# Patient Record
Sex: Female | Born: 1940 | Race: White | Hispanic: No | Smoking: Never smoker
Health system: Southern US, Community
[De-identification: ages and names within clinical notes are randomized; demographics above are authoritative.]

## PROBLEM LIST (undated history)

## (undated) DIAGNOSIS — E119 Type 2 diabetes mellitus without complications: Secondary | ICD-10-CM

## (undated) DIAGNOSIS — J449 Chronic obstructive pulmonary disease, unspecified: Secondary | ICD-10-CM

## (undated) DIAGNOSIS — I10 Essential (primary) hypertension: Secondary | ICD-10-CM

## (undated) DIAGNOSIS — R112 Nausea with vomiting, unspecified: Secondary | ICD-10-CM

## (undated) DIAGNOSIS — E079 Disorder of thyroid, unspecified: Secondary | ICD-10-CM

## (undated) HISTORY — PX: TONSILLECTOMY: SUR1361

## (undated) HISTORY — PX: KNEE SURGERY: SHX244

## (undated) HISTORY — PX: HYSTERECTOMY ABDOMINAL WITH SALPINGECTOMY: SHX6725

---

## 2005-08-21 ENCOUNTER — Emergency Department (HOSPITAL_COMMUNITY): Admission: EM | Admit: 2005-08-21 | Discharge: 2005-08-21 | Payer: Self-pay | Admitting: Emergency Medicine

## 2020-06-21 HISTORY — PX: GALLBLADDER SURGERY: SHX652

## 2020-09-17 ENCOUNTER — Emergency Department (HOSPITAL_COMMUNITY)
Admission: EM | Admit: 2020-09-17 | Discharge: 2020-09-17 | Disposition: A | Discharge status: Home / Self Care | Payer: 59 | Attending: Emergency Medicine | Admitting: Emergency Medicine

## 2020-09-17 ENCOUNTER — Encounter (HOSPITAL_COMMUNITY): Payer: Self-pay | Admitting: Emergency Medicine

## 2020-09-17 DIAGNOSIS — Z794 Long term (current) use of insulin: Secondary | ICD-10-CM | POA: Diagnosis not present

## 2020-09-17 DIAGNOSIS — Y92009 Unspecified place in unspecified non-institutional (private) residence as the place of occurrence of the external cause: Secondary | ICD-10-CM

## 2020-09-17 DIAGNOSIS — M25512 Pain in left shoulder: Secondary | ICD-10-CM | POA: Insufficient documentation

## 2020-09-17 DIAGNOSIS — R296 Repeated falls: Secondary | ICD-10-CM | POA: Diagnosis not present

## 2020-09-17 DIAGNOSIS — R062 Wheezing: Secondary | ICD-10-CM | POA: Diagnosis not present

## 2020-09-17 DIAGNOSIS — E1165 Type 2 diabetes mellitus with hyperglycemia: Secondary | ICD-10-CM | POA: Diagnosis not present

## 2020-09-17 DIAGNOSIS — R739 Hyperglycemia, unspecified: Secondary | ICD-10-CM

## 2020-09-17 DIAGNOSIS — W19XXXA Unspecified fall, initial encounter: Secondary | ICD-10-CM

## 2020-09-17 LAB — CBC WITH DIFFERENTIAL/PLATELET
Abs Immature Granulocytes: 0.12 10*3/uL — ABNORMAL HIGH (ref 0.00–0.07)
Basophils Absolute: 0 10*3/uL (ref 0.0–0.1)
Basophils Relative: 0 %
Eosinophils Absolute: 0 10*3/uL (ref 0.0–0.5)
Eosinophils Relative: 0 %
HCT: 35.4 % — ABNORMAL LOW (ref 36.0–46.0)
Hemoglobin: 11.2 g/dL — ABNORMAL LOW (ref 12.0–15.0)
Immature Granulocytes: 1 %
Lymphocytes Relative: 8 %
Lymphs Abs: 1.1 10*3/uL (ref 0.7–4.0)
MCH: 29.8 pg (ref 26.0–34.0)
MCHC: 31.6 g/dL (ref 30.0–36.0)
MCV: 94.1 fL (ref 80.0–100.0)
Monocytes Absolute: 1 10*3/uL (ref 0.1–1.0)
Monocytes Relative: 7 %
Neutro Abs: 11.1 10*3/uL — ABNORMAL HIGH (ref 1.7–7.7)
Neutrophils Relative %: 84 %
Platelets: 255 10*3/uL (ref 150–400)
RBC: 3.76 MIL/uL — ABNORMAL LOW (ref 3.87–5.11)
RDW: 14.9 % (ref 11.5–15.5)
WBC: 13.3 10*3/uL — ABNORMAL HIGH (ref 4.0–10.5)
nRBC: 0 % (ref 0.0–0.2)

## 2020-09-17 LAB — URINALYSIS, ROUTINE W REFLEX MICROSCOPIC
Bacteria, UA: NONE SEEN
Bilirubin Urine: NEGATIVE
Glucose, UA: >=500 mg/dL — AB
Hgb urine dipstick: NEGATIVE
Ketones, ur: NEGATIVE mg/dL
Leukocytes,Ua: NEGATIVE
Nitrite: NEGATIVE
Protein, ur: NEGATIVE mg/dL
Specific Gravity, Urine: 1.028 (ref 1.005–1.030)
pH: 5 (ref 5.0–8.0)

## 2020-09-17 LAB — CBG MONITORING, ED
Glucose-Capillary: 382 mg/dL — ABNORMAL HIGH (ref 70–99)
Glucose-Capillary: 270 mg/dL — ABNORMAL HIGH (ref 70–99)

## 2020-09-17 LAB — COMPREHENSIVE METABOLIC PANEL
ALT: 16 U/L (ref 0–44)
AST: 26 U/L (ref 15–41)
Albumin: 3.3 g/dL — ABNORMAL LOW (ref 3.5–5.0)
Alkaline Phosphatase: 138 U/L — ABNORMAL HIGH (ref 38–126)
Anion gap: 9 (ref 5–15)
BUN: 17 mg/dL (ref 8–23)
CO2: 24 mmol/L (ref 22–32)
Calcium: 9.1 mg/dL (ref 8.9–10.3)
Chloride: 107 mmol/L (ref 98–111)
Creatinine, Ser: 1.24 mg/dL — ABNORMAL HIGH (ref 0.44–1.00)
GFR, Estimated: 45 mL/min — ABNORMAL LOW (ref >60)
Glucose, Bld: 427 mg/dL — ABNORMAL HIGH (ref 70–99)
Potassium: 4.4 mmol/L (ref 3.5–5.1)
Sodium: 140 mmol/L (ref 135–145)
Total Bilirubin: 0.3 mg/dL (ref 0.3–1.2)
Total Protein: 6.1 g/dL — ABNORMAL LOW (ref 6.5–8.1)

## 2020-09-17 MED ORDER — INSULIN ASPART 100 UNIT/ML ~~LOC~~ SOLN
8.0000 [IU] | Freq: Once | SUBCUTANEOUS | Status: AC
  Administered 2020-09-17: 8 [IU] via SUBCUTANEOUS

## 2020-09-17 MED ORDER — ALBUTEROL SULFATE HFA 108 (90 BASE) MCG/ACT IN AERS
2.0000 | INHALATION_SPRAY | Freq: Once | RESPIRATORY_TRACT | Status: AC
  Administered 2020-09-17: 2 via RESPIRATORY_TRACT
  Filled 2020-09-17: qty 6.7

## 2020-09-17 NOTE — ED Triage Notes (Signed)
Pt brought in by EMS from home for hyperglycemia. Crew states pt received 50 units of insulin at home by her sister prior to their arrival. Pt's blood glucose was 512.

## 2020-09-17 NOTE — ED Notes (Signed)
Pt discharged and wheeled out of the ED in a wheel chair. 

## 2020-09-17 NOTE — Discharge Instructions (Addendum)
Please check your blood sugar regularly throughout today. Return with any concern for unstable levels.   Use your walker at all times and only walk when you have assistance.   Continue your efforts with your family to get in-home assistance when you return home to [place].

## 2020-09-17 NOTE — ED Provider Notes (Signed)
[facility] EMERGENCY DEPARTMENT Provider Note   CSN: [id number] Arrival date & time: 09/17/20  0158     History Chief Complaint  Patient presents with  . Fall  . Hyperglycemia    Linda Wall is a 80 y.o. female.  Patient to ED after fall this evening. Per patient and her sister at bedside, she has been falling on a regular basis for the past 3 months. Per EMS, the patient was found to have hyperglycemia of 504 on their arrival. She self administered a dose of her insulin around 1:05 am. No nausea, vomiting. She states she falls because "I just go limp". She walk with use of a walker. She lives in [place], [place], and is here staying with her sister while work is being done to her house, but usually lives alone. She currently complains only of left shoulder pain which is not new today.   The history is provided by the patient and a relative. No language interpreter was used.  Fall Pertinent negatives include no chest pain, no abdominal pain, no headaches and no shortness of breath.  Hyperglycemia Associated symptoms: no abdominal pain, no chest pain, no dizziness, no fever, no nausea, no shortness of breath and no weakness        No past medical history on file.  There are no problems to display for this patient.   OB History   No obstetric history on file.     No family history on file.     Home Medications Prior to Admission medications   Not on File    Allergies    Patient has no allergy information on record.  Review of Systems   Review of Systems  Constitutional: Negative for chills and fever.  HENT: Negative.   Respiratory: Negative.  Negative for shortness of breath.   Cardiovascular: Negative.  Negative for chest pain.  Gastrointestinal: Negative.  Negative for abdominal pain and nausea.  Musculoskeletal:       Left shoulder pain  Skin: Positive for color change.  Neurological: Negative for dizziness, weakness and headaches.       UE  tremor present.     Physical Exam Updated Vital Signs There were no vitals taken for this visit.  Physical Exam Vitals and nursing note reviewed.  Constitutional:      General: She is not in acute distress.    Appearance: Normal appearance. She is obese.  HENT:     Head: Normocephalic and atraumatic.  Eyes:     Conjunctiva/sclera: Conjunctivae normal.  Cardiovascular:     Rate and Rhythm: Normal rate and regular rhythm.     Heart sounds: No murmur heard.   Pulmonary:     Effort: Pulmonary effort is normal.     Breath sounds: Wheezing (Audible wheezing) present. No rhonchi or rales.  Abdominal:     General: There is no distension.     Palpations: Abdomen is soft.     Tenderness: There is no abdominal tenderness.  Musculoskeletal:        General: Normal range of motion.     Cervical back: Normal range of motion and neck supple.  Skin:    General: Skin is warm and dry.     Findings: Bruising (bruising to multiple areas of UE's in various stages of healing. ) present.  Neurological:     Mental Status: She is alert and oriented to person, place, and time.     Motor: No weakness.     Comments:  UE tremor present     ED Results / Procedures / Treatments   Labs (all labs ordered are listed, but only abnormal results are displayed) Labs Reviewed - No data to display Results for orders placed or performed during the hospital encounter of 09/17/20  Urine culture   Specimen: Urine, Random  Result Value Ref Range   Specimen Description URINE, RANDOM    Special Requests NONE    Culture (A)     >=100,000 COLONIES/mL AEROCOCCUS SPECIES Standardized susceptibility testing for this organism is not available. Performed at [facility], [address], [place], [place] [address]    Report Status 09/19/2020 FINAL   CBC with Differential  Result Value Ref Range   WBC 13.3 (H) 4.0 - 10.5 K/uL   RBC 3.76 (L) 3.87 - 5.11 MIL/uL   Hemoglobin 11.2 (L) 12.0 - 15.0 g/dL   HCT  66.0 (L) 63.0 - 46.0 %   MCV 94.1 80.0 - 100.0 fL   MCH 29.8 26.0 - 34.0 pg   MCHC 31.6 30.0 - 36.0 g/dL   RDW 16.0 10.9 - 32.3 %   Platelets 255 150 - 400 K/uL   nRBC 0.0 0.0 - 0.2 %   Neutrophils Relative % 84 %   Neutro Abs 11.1 (H) 1.7 - 7.7 K/uL   Lymphocytes Relative 8 %   Lymphs Abs 1.1 0.7 - 4.0 K/uL   Monocytes Relative 7 %   Monocytes Absolute 1.0 0.1 - 1.0 K/uL   Eosinophils Relative 0 %   Eosinophils Absolute 0.0 0.0 - 0.5 K/uL   Basophils Relative 0 %   Basophils Absolute 0.0 0.0 - 0.1 K/uL   Immature Granulocytes 1 %   Abs Immature Granulocytes 0.12 (H) 0.00 - 0.07 K/uL  Comprehensive metabolic panel  Result Value Ref Range   Sodium 140 135 - 145 mmol/L   Potassium 4.4 3.5 - 5.1 mmol/L   Chloride 107 98 - 111 mmol/L   CO2 24 22 - 32 mmol/L   Glucose, Bld 427 (H) 70 - 99 mg/dL   BUN 17 8 - 23 mg/dL   Creatinine, Ser 5.57 (H) 0.44 - 1.00 mg/dL   Calcium 9.1 8.9 - 32.2 mg/dL   Total Protein 6.1 (L) 6.5 - 8.1 g/dL   Albumin 3.3 (L) 3.5 - 5.0 g/dL   AST 26 15 - 41 U/L   ALT 16 0 - 44 U/L   Alkaline Phosphatase 138 (H) 38 - 126 U/L   Total Bilirubin 0.3 0.3 - 1.2 mg/dL   GFR, Estimated 45 (L) >60 mL/min   Anion gap 9 5 - 15  Urinalysis, Routine w reflex microscopic Urine, Clean Catch  Result Value Ref Range   Color, Urine YELLOW YELLOW   APPearance CLEAR CLEAR   Specific Gravity, Urine 1.028 1.005 - 1.030   pH 5.0 5.0 - 8.0   Glucose, UA >=500 (A) NEGATIVE mg/dL   Hgb urine dipstick NEGATIVE NEGATIVE   Bilirubin Urine NEGATIVE NEGATIVE   Ketones, ur NEGATIVE NEGATIVE mg/dL   Protein, ur NEGATIVE NEGATIVE mg/dL   Nitrite NEGATIVE NEGATIVE   Leukocytes,Ua NEGATIVE NEGATIVE   RBC / HPF 6-10 0 - 5 RBC/hpf   WBC, UA 6-10 0 - 5 WBC/hpf   Bacteria, UA NONE SEEN NONE SEEN   Squamous Epithelial / LPF 0-5 0 - 5   Amorphous Crystal PRESENT   CBG monitoring, ED  Result Value Ref Range   Glucose-Capillary 382 (H) 70 - 99 mg/dL  POC CBG, ED  Result Value Ref Range    Glucose-Capillary 270 (H) 70 - 99 mg/dL    EKG None  Radiology No results found.  Procedures Procedures   Medications Ordered in ED Medications - No data to display  ED Course  I have reviewed the triage vital signs and the nursing notes.  Pertinent labs & imaging results that were available during my care of the patient were reviewed by me and considered in my medical decision making (see chart for details).    MDM Rules/Calculators/A&P                          Patient with frequent falls presents with fall tonight while with her sister and concern for elevated blood sugar.   The patient lives alone in [place]. She wants to continue living alone and does not want to consider nursing home placement. She reports that her son is working on in-home care to support this.   CBG checked in the ED over observation period. Labs reviewed and are reassuring. No imaging is felt indicated. She complains of left shoulder pain but this has been evaluated previously and is an ongoing issue.  Sister feels she has remained at baseline. VSS. CBG improved. She can be discharged home into her sister's care with plan to return home and pursuit of in home services with her son.   Final Clinical Impression(s) / ED Diagnoses Final diagnoses:  None   1. Fall 2. Hyperglycemia without ketosis.  Rx / DC Orders ED Discharge Orders    None       Danne Harbor 09/23/20 1610    Palumbo, April, MD 09/23/20 2300

## 2020-09-19 LAB — URINE CULTURE: Culture: >=100000 — AB

## 2020-11-19 ENCOUNTER — Ambulatory Visit
Admission: EM | Admit: 2020-11-19 | Discharge: 2020-11-19 | Disposition: A | Discharge status: Home / Self Care | Payer: 59

## 2020-11-19 ENCOUNTER — Other Ambulatory Visit: Payer: Self-pay

## 2020-11-19 DIAGNOSIS — S51811A Laceration without foreign body of right forearm, initial encounter: Secondary | ICD-10-CM | POA: Diagnosis not present

## 2020-11-19 HISTORY — DX: Type 2 diabetes mellitus without complications: E11.9

## 2020-11-19 HISTORY — DX: Chronic obstructive pulmonary disease, unspecified: J44.9

## 2020-11-19 HISTORY — DX: Essential (primary) hypertension: I10

## 2020-11-19 HISTORY — DX: Disorder of thyroid, unspecified: E07.9

## 2020-11-19 MED ORDER — MUPIROCIN 2 % EX OINT: 1.0000 "application " | TOPICAL_OINTMENT | Freq: Two times a day (BID) | CUTANEOUS | 0 refills | Status: AC

## 2020-11-19 NOTE — ED Provider Notes (Addendum)
[facility]    CSN: [id number] Arrival date & time: 11/19/20  1128      History   Chief Complaint Chief Complaint  Patient presents with  . Extremity Laceration    Right arm      HPI Linda Wall is a 80 y.o. female history of hypertension, COPD, DM type II presenting today for evaluation of skin tear.  Sustained skin tear to right forearm last night, tripped and fell outside and landed in the grass.  Reports that this was a mechanical fall and denies any preceding dizziness lightheadedness or syncope.  Denies hitting head or loss of consciousness.  Denies use of blood thinners.  Denies any difficulty bending or moving her arm.  Believes tetanus up-to-date  HPI  Past Medical History:  Diagnosis Date  . COPD (chronic obstructive pulmonary disease) (HCC)   . Diabetes mellitus without complication (HCC)   . Hypertension   . Thyroid disease     There are no problems to display for this patient.   Past Surgical History:  Procedure Laterality Date  . GALLBLADDER SURGERY  2022  . HYSTERECTOMY ABDOMINAL WITH SALPINGECTOMY    . KNEE SURGERY Right   . TONSILLECTOMY      OB History   No obstetric history on file.      Home Medications    Prior to Admission medications   Medication Sig Start Date End Date Taking? Authorizing Provider  buPROPion (WELLBUTRIN XL) 150 MG 24 hr tablet 150 mg per 1 tablet, ORAL, qAM (every morning), 30 tablet, 11 Refill(s), Pharmacy: [id number], Height, 165.1, cm, 11/27/19 10:07:00 EDT, Weight, 84.142, kg, 11/27/19 10:07:00 EDT 11/27/19  Yes [provider]  diclofenac Sodium (VOLTAREN) 1 % GEL  04/04/20  Yes [provider]  escitalopram (LEXAPRO) 10 MG tablet 1 tablet, ORAL, Daily, 30 tablet, 6 Refill(s), Pharmacy: [id number], DX Code Needed  ., Height, 165.1, cm, 12/27/19 12:38:00 EDT, Weight, 84.188, kg, 12/27/19 12:38:00 EDT 12/27/19  Yes [provider]  esomeprazole (NEXIUM) 40 MG capsule  Take by mouth. 08/08/16  Yes [provider]  furosemide (LASIX) 40 MG tablet Take by mouth. 08/30/16  Yes [provider]  insulin glargine (LANTUS SOLOSTAR) 100 UNIT/ML Solostar Pen Inject into the skin. 08/27/16  Yes [provider]  Insulin Pen Needle (B-D ULTRAFINE III SHORT PEN) 31G X 8 MM MISC  08/25/16  Yes [provider]  levothyroxine (SYNTHROID) 100 MCG tablet Take by mouth. 06/21/16  Yes [provider]  LORazepam (ATIVAN) 1 MG tablet 1 mg per 1 tablet, ORAL, TID (3 times a day), prn anxiety/tremor. Do not take with Tramadol., 30 day(s), 90 tablet, 0 Refill(s), Pharmacy: [facility] [place], [place]), Height, 165.1, cm, 04/04/20 9:19:00 EDT, Weight, 83.462, kg, 04/04/20 9:19:00 EDT 04/14/20  Yes [provider]  meloxicam (MOBIC) 7.5 MG tablet Take by mouth. 08/27/16  Yes [provider]  mupirocin ointment (BACTROBAN) 2 % Apply 1 application topically 2 (two) times daily. 11/19/20  Yes Germani Gavilanes C, PA-C  pantoprazole (PROTONIX) 40 MG tablet Take by mouth. 07/20/18  Yes [provider]  simvastatin (ZOCOR) 40 MG tablet Take by mouth. 07/15/16  Yes [provider]  tamoxifen (NOLVADEX) 20 MG tablet Take by mouth. 08/25/16  Yes [provider]  traMADol Janean Sark) 50 MG tablet Take by mouth. 08/16/16  Yes [provider]  ACCU-CHEK GUIDE test strip  09/19/20   [provider]  albuterol (VENTOLIN HFA) 108 (90 Base) MCG/ACT inhaler Inhale  into the lungs. 09/09/20   [provider]  ANORO ELLIPTA 62.5-25 MCG/INH AEPB Inhale 1 puff into the lungs daily. 10/14/20   [provider]  cetirizine (ZYRTEC) 10 MG tablet Take by mouth.    [provider]  LORazepam (ATIVAN) 0.5 MG tablet Take 0.5 mg by mouth daily as needed. 10/02/20   [provider]  metFORMIN (GLUCOPHAGE-XR) 500 MG 24 hr tablet Take 500 mg by mouth 2 (two) times daily. 10/01/20   [provider]   ondansetron (ZOFRAN) 4 MG tablet Take by mouth.    [provider]  pioglitazone (ACTOS) 15 MG tablet Take 15 mg by mouth daily. 08/28/20   [provider]  tiZANidine (ZANAFLEX) 4 MG tablet Take 4 mg by mouth at bedtime. 09/19/20   [provider]  tolterodine (DETROL LA) 4 MG 24 hr capsule Take by mouth.    [provider]  VICTOZA 18 MG/3ML SOPN Inject into the skin. 10/08/20   [provider]    Family History History reviewed. No pertinent family history.  Social History Social History   Tobacco Use  . Smoking status: Never Smoker  . Smokeless tobacco: Never Used  Vaping Use  . Vaping Use: Never used  Substance Use Topics  . Alcohol use: Not Currently  . Drug use: Never     Allergies   Bactrim [sulfamethoxazole-trimethoprim], Shellfish-derived products, and Sulfa antibiotics   Review of Systems Review of Systems  Constitutional: Negative for fatigue and fever.  HENT: Negative for mouth sores.   Eyes: Negative for visual disturbance.  Respiratory: Negative for shortness of breath.   Cardiovascular: Negative for chest pain.  Gastrointestinal: Negative for abdominal pain, nausea and vomiting.  Genitourinary: Negative for genital sores.  Musculoskeletal: Negative for arthralgias and joint swelling.  Skin: Positive for color change and wound. Negative for rash.  Neurological: Negative for dizziness, weakness, light-headedness and headaches.     Physical Exam Triage Vital Signs ED Triage Vitals  Enc Vitals Group     BP 11/19/20 1141 127/70     Pulse Rate 11/19/20 1141 87     Resp --      Temp 11/19/20 1141 97.7 F (36.5 C)     Temp Source 11/19/20 1141 Oral     SpO2 11/19/20 1141 95 %     Weight --      Height --      Head Circumference --      Peak Flow --      Pain Score 11/19/20 1149 0     Pain Loc --      Pain Edu? --      Excl. in GC? --    No data found.  Updated Vital Signs BP 127/70 (BP Location: Left  Arm)   Pulse 87   Temp 97.7 F (36.5 C) (Oral)   SpO2 95%   Visual Acuity Right Eye Distance:   Left Eye Distance:   Bilateral Distance:    Right Eye Near:   Left Eye Near:    Bilateral Near:     Physical Exam Vitals and nursing note reviewed.  Constitutional:      Appearance: She is well-developed.     Comments: No acute distress  HENT:     Head: Normocephalic and atraumatic.     Nose: Nose normal.  Eyes:     Conjunctiva/sclera: Conjunctivae normal.  Cardiovascular:     Rate and Rhythm: Normal rate.  Pulmonary:     Effort: Pulmonary effort  is normal. No respiratory distress.  Abdominal:     General: There is no distension.  Musculoskeletal:        General: Normal range of motion.     Cervical back: Neck supple.     Comments: Patient willingly and actively using right elbow hand and wrist  Skin:    General: Skin is warm and dry.     Comments: Large skin tear noted over proximal forearm, bleeding controlled  Neurological:     Mental Status: She is alert and oriented to person, place, and time.      UC Treatments / Results  Labs (all labs ordered are listed, but only abnormal results are displayed) Labs Reviewed - No data to display  EKG   Radiology No results found.  Procedures Procedures (including critical care time)  Medications Ordered in UC Medications - No data to display  Initial Impression / Assessment and Plan / UC Course  I have reviewed the triage vital signs and the nursing notes.  Pertinent labs & imaging results that were available during my care of the patient were reviewed by me and considered in my medical decision making (see chart for details).     Wound irrigated with saline and Shur-Clens, reapproximated edges of skin tear as best as possible and applied Steri-Strips to keep in place well-healing, discussed wound care, cleansing and monitoring for gradual resolution.  Monitor for signs of infection.  Low suspicion of underlying  fracture.  Discussed strict return precautions. Patient verbalized understanding and is agreeable with plan.  Final Clinical Impressions(s) / UC Diagnoses   Final diagnoses:  Skin tear of right forearm without complication, initial encounter     Discharge Instructions     Keep clean and dry Steri strips will fall off gradually, may trim edges if curling up Wash warm soapy watery twice daily and dry well afterward May use bactroban ointment to area Use non-adherent dressings Follow-up if developing any concerns regarding healing or any signs of infection   ED Prescriptions    Medication Sig Dispense Auth. Provider   mupirocin ointment (BACTROBAN) 2 % Apply 1 application topically 2 (two) times daily. 30 g Riko Lumsden, [place] C, PA-C     PDMP not reviewed this encounter.   Lew Dawes, PA-C 11/19/20 1242    Dallen Bunte, [place] C, PA-C 11/19/20 1243

## 2020-11-19 NOTE — ED Triage Notes (Addendum)
Patient presents to Urgent Care with complaints of a right lower arm laceration. She states she tripped and fell. EMS was called and pt accessed. They instructed her to go to hospital pt declined. She reports EMS wrapped her arm with guaze and secured with a bandage.  Denies hitting her head, losing consciousness, or any numbness/tingling to arm.

## 2020-11-19 NOTE — Discharge Instructions (Signed)
Keep clean and dry Steri strips will fall off gradually, may trim edges if curling up Wash warm soapy watery twice daily and dry well afterward May use bactroban ointment to area Use non-adherent dressings Follow-up if developing any concerns regarding healing or any signs of infection

## 2020-11-25 ENCOUNTER — Other Ambulatory Visit: Payer: Self-pay | Admitting: Urology

## 2020-11-25 DIAGNOSIS — N200 Calculus of kidney: Secondary | ICD-10-CM

## 2020-11-28 ENCOUNTER — Encounter (HOSPITAL_BASED_OUTPATIENT_CLINIC_OR_DEPARTMENT_OTHER): Payer: Self-pay | Admitting: Urology

## 2020-11-28 NOTE — Progress Notes (Signed)
Spoke with patients daughter in law, Steward Drone who wanted to take instructions for procedure on Monday. She will call back with an updated list of patients medications this afternoon. We did review procedure instructions. Patient to arrive at 0815 on 12/01/2020. NPO after MN on Sunday except for clear liquids until 0615. Driver secured.

## 2020-11-28 NOTE — Progress Notes (Signed)
Medications reviewed with daughter in law. Instructed to take BP medication am of procedure. Patient reports to daughter in law that she takes her metformin and insulin in am only. Instructed to hold these on Monday morning.

## 2020-12-01 ENCOUNTER — Ambulatory Visit (HOSPITAL_COMMUNITY): Payer: 59

## 2020-12-01 ENCOUNTER — Other Ambulatory Visit: Payer: Self-pay

## 2020-12-01 ENCOUNTER — Encounter (HOSPITAL_BASED_OUTPATIENT_CLINIC_OR_DEPARTMENT_OTHER): Payer: Self-pay | Admitting: Urology

## 2020-12-01 ENCOUNTER — Ambulatory Visit (HOSPITAL_BASED_OUTPATIENT_CLINIC_OR_DEPARTMENT_OTHER)
Admission: RE | Admit: 2020-12-01 | Discharge: 2020-12-01 | Disposition: A | Discharge status: Home / Self Care | Payer: 59 | Attending: Urology | Admitting: Urology

## 2020-12-01 ENCOUNTER — Encounter (HOSPITAL_BASED_OUTPATIENT_CLINIC_OR_DEPARTMENT_OTHER)
Admission: RE | Disposition: A | Discharge status: Home / Self Care | Payer: Self-pay | Source: Home / Self Care | Attending: Urology

## 2020-12-01 DIAGNOSIS — Z881 Allergy status to other antibiotic agents status: Secondary | ICD-10-CM | POA: Diagnosis not present

## 2020-12-01 DIAGNOSIS — E119 Type 2 diabetes mellitus without complications: Secondary | ICD-10-CM | POA: Diagnosis not present

## 2020-12-01 DIAGNOSIS — I10 Essential (primary) hypertension: Secondary | ICD-10-CM | POA: Diagnosis not present

## 2020-12-01 DIAGNOSIS — Z882 Allergy status to sulfonamides status: Secondary | ICD-10-CM | POA: Insufficient documentation

## 2020-12-01 DIAGNOSIS — J449 Chronic obstructive pulmonary disease, unspecified: Secondary | ICD-10-CM | POA: Diagnosis not present

## 2020-12-01 DIAGNOSIS — N200 Calculus of kidney: Secondary | ICD-10-CM

## 2020-12-01 HISTORY — DX: Nausea with vomiting, unspecified: R11.2

## 2020-12-01 HISTORY — PX: EXTRACORPOREAL SHOCK WAVE LITHOTRIPSY: SHX1557

## 2020-12-01 LAB — GLUCOSE, CAPILLARY
Glucose-Capillary: 100 mg/dL — ABNORMAL HIGH (ref 70–99)
Glucose-Capillary: 54 mg/dL — ABNORMAL LOW (ref 70–99)
Glucose-Capillary: 187 mg/dL — ABNORMAL HIGH (ref 70–99)
Glucose-Capillary: 67 mg/dL — ABNORMAL LOW (ref 70–99)
Glucose-Capillary: 78 mg/dL (ref 70–99)

## 2020-12-01 SURGERY — LITHOTRIPSY, ESWL
Anesthesia: LOCAL | Laterality: Right

## 2020-12-01 MED ORDER — HYDROCODONE-ACETAMINOPHEN 10-325 MG PO TABS: 1.0000 | ORAL_TABLET | Freq: Four times a day (QID) | ORAL | 0 refills | Status: AC | PRN

## 2020-12-01 MED ORDER — DEXTROSE 50 % IV SOLN
50.0000 mL | Freq: Once | INTRAVENOUS | Status: AC
  Administered 2020-12-01: 50 mL via INTRAVENOUS

## 2020-12-01 MED ORDER — TAMSULOSIN HCL 0.4 MG PO CAPS: 0.4000 mg | ORAL_CAPSULE | Freq: Every day | ORAL | 0 refills | Status: AC

## 2020-12-01 MED ORDER — SODIUM CHLORIDE 0.9 % IV SOLN: INTRAVENOUS | Status: DC

## 2020-12-01 MED ORDER — CIPROFLOXACIN HCL 500 MG PO TABS
ORAL_TABLET | ORAL | Status: AC
  Filled 2020-12-01: qty 1

## 2020-12-01 MED ORDER — DEXTROSE 50 % IV SOLN
INTRAVENOUS | Status: AC
  Filled 2020-12-01: qty 50

## 2020-12-01 MED ORDER — DIPHENHYDRAMINE HCL 25 MG PO CAPS
ORAL_CAPSULE | ORAL | Status: AC
  Filled 2020-12-01: qty 1

## 2020-12-01 MED ORDER — DIPHENHYDRAMINE HCL 25 MG PO CAPS
25.0000 mg | ORAL_CAPSULE | ORAL | Status: AC
  Administered 2020-12-01: 25 mg via ORAL

## 2020-12-01 MED ORDER — DOCUSATE SODIUM 100 MG PO CAPS: 100.0000 mg | ORAL_CAPSULE | Freq: Every day | ORAL | 0 refills | Status: AC | PRN

## 2020-12-01 MED ORDER — DIAZEPAM 5 MG PO TABS
10.0000 mg | ORAL_TABLET | ORAL | Status: AC
  Administered 2020-12-01: 10 mg via ORAL

## 2020-12-01 MED ORDER — DIAZEPAM 5 MG PO TABS
ORAL_TABLET | ORAL | Status: AC
  Filled 2020-12-01: qty 2

## 2020-12-01 MED ORDER — CIPROFLOXACIN HCL 500 MG PO TABS
500.0000 mg | ORAL_TABLET | ORAL | Status: AC
  Administered 2020-12-01: 500 mg via ORAL

## 2020-12-01 NOTE — Op Note (Signed)
[facility] Operative Note  Treating Physician: Arvo Ealy, MD  Pre-op diagnosis: Right renal stone  Post-op diagnosis: Same   Procedure: Right ESWL  See [facility] OP note scanned into chart. Also because of the size, density, location and other factors that cannot be anticipated I feel this will likely be a staged procedure. This fact supersedes any indication in the scanned [facility] operative note to the contrary.  Matt R. Ashla Murph MD [facility]  Pager: [phone]   

## 2020-12-01 NOTE — H&P (Signed)
Urology Preoperative H&P   Chief Complaint: Right renal stone  History of Present Illness: Linda Wall is a 80 y.o. female with a right renal stone here for right ESWL. Denies fevers, chills, dysuria.  Past Medical History:  Diagnosis Date   COPD (chronic obstructive pulmonary disease) (HCC)    Diabetes mellitus without complication (HCC)    Hypertension    PONV (postoperative nausea and vomiting)    Thyroid disease     Past Surgical History:  Procedure Laterality Date   GALLBLADDER SURGERY  2022   HYSTERECTOMY ABDOMINAL WITH SALPINGECTOMY     KNEE SURGERY Right    TONSILLECTOMY      Allergies:  Allergies  Allergen Reactions   Bactrim [Sulfamethoxazole-Trimethoprim]    Shellfish-Derived Products     Allergic to sulfa drugs    Sulfa Antibiotics Rash    History reviewed. No pertinent family history.  Social History:  reports that she has never smoked. She has never used smokeless tobacco. She reports previous alcohol use. She reports that she does not use drugs.  ROS: A complete review of systems was performed.  All systems are negative except for pertinent findings as noted.  Physical Exam:  Vital signs in last 24 hours: Temp:  [97.1 F (36.2 C)] 97.1 F (36.2 C) (06/13 1011) Pulse Rate:  [81] 81 (06/13 1011) Resp:  [18] 18 (06/13 1011) BP: (161)/(89) 161/89 (06/13 1011) SpO2:  [96 %] 96 % (06/13 1011) Weight:  [79.8 kg] 79.8 kg (06/13 1011) Constitutional:  Alert and oriented, No acute distress Cardiovascular: Regular rate and rhythm Respiratory: Normal respiratory effort, Lungs clear bilaterally GI: Abdomen is soft, nontender, nondistended, no abdominal masses GU: No CVA tenderness Lymphatic: No lymphadenopathy Neurologic: Grossly intact, no focal deficits Psychiatric: Normal mood and affect  Laboratory Data:  No results for input(s): WBC, HGB, HCT, PLT in the last 72 hours.  No results for input(s): NA, K, CL, GLUCOSE, BUN, CALCIUM, CREATININE in the last  72 hours.  Invalid input(s): CO3   Results for orders placed or performed during the hospital encounter of 12/01/20 (from the past 24 hour(s))  Glucose, capillary     Status: Abnormal   Collection Time: 12/01/20 10:07 AM  Result Value Ref Range   Glucose-Capillary 100 (H) 70 - 99 mg/dL  Glucose, capillary     Status: Abnormal   Collection Time: 12/01/20  2:12 PM  Result Value Ref Range   Glucose-Capillary 54 (L) 70 - 99 mg/dL  Glucose, capillary     Status: Abnormal   Collection Time: 12/01/20  2:40 PM  Result Value Ref Range   Glucose-Capillary 187 (H) 70 - 99 mg/dL   No results found for this or any previous visit (from the past 240 hour(s)).  Renal Function: No results for input(s): CREATININE in the last 168 hours. CrCl cannot be calculated (Patient's most recent lab result is older than the maximum 21 days allowed.).  Radiologic Imaging: DG Abd 1 View  Result Date: 12/01/2020 CLINICAL DATA:  Pre lithotripsy for right-sided stone. EXAM: ABDOMEN - 1 VIEW COMPARISON:  CT 11/07/2020.  Radiograph 11/19/2020 FINDINGS: Elongated 10 mm calcification projects over the central right renal shadow corresponding to stone on prior CT. No evidence of additional renal calculi. Calcifications in the left pelvis are vascular and phleboliths. Normal bowel gas pattern with small volume of colonic stool. Included lung bases are clear. Scoliotic curvature of the spine. No acute osseous abnormalities are seen. IMPRESSION: A 10 mm calcification projects over the central right  renal shadow corresponding to stone on prior CT. Electronically Signed   By: Narda Rutherford M.D.   On: 12/01/2020 13:53    I independently reviewed the above imaging studies.  Assessment and Plan Linda Wall is a 80 y.o. female with right renal stone here for right ESWL. Denies fevers, chills, dysuria.  The risks, benefits and alternatives of right ESWL was discussed with the patient. I described the risks which include  arrhythmia, kidney contusion, kidney hemorrhage, need for transfusion, back discomfort, flank ecchymosis, flank abrasion, inability to fracture the stone, inability to pass stone fragments, Steinstrasse, infection associated with obstructing stones, need for an alternative surgical procedure and possible need for repeat shockwave lithotripsy.  The patient voices understanding and wishes to proceed.    Matt R. Syrus Nakama MD 12/01/2020, 3:11 PM  [facility] Pager: [phone]

## 2020-12-01 NOTE — Discharge Instructions (Addendum)
Activity:  You are encouraged to ambulate frequently (about every hour during waking hours) to help prevent blood clots from forming in your legs or lungs.    Diet: You should advance your diet as instructed by your physician.  It will be normal to have some bloating, nausea, and abdominal discomfort intermittently.  Prescriptions:  You will be provided a prescription for pain medication to take as needed.  If your pain is not severe enough to require the prescription pain medication, you may take extra strength Tylenol instead which will have less side effects.  You should also take a prescribed stool softener to avoid straining with bowel movements as the prescription pain medication may constipate you.  What to call us about: You should call the office ([phone]) if you develop fever > 101 or develop persistent vomiting. Activity:  You are encouraged to ambulate frequently (about every hour during waking hours) to help prevent blood clots from forming in your legs or lungs.          Post Anesthesia Home Care Instructions  Activity: Get plenty of rest for the remainder of the day. A responsible individual must stay with you for 24 hours following the procedure.  For the next 24 hours, DO NOT: -Drive a car -Operate machinery -Drink alcoholic beverages -Take any medication unless instructed by your physician -Make any legal decisions or sign important papers.  Meals: Start with liquid foods such as gelatin or soup. Progress to regular foods as tolerated. Avoid greasy, spicy, heavy foods. If nausea and/or vomiting occur, drink only clear liquids until the nausea and/or vomiting subsides. Call your physician if vomiting continues.  Special Instructions/Symptoms: Your throat may feel dry or sore from the anesthesia or the breathing tube placed in your throat during surgery. If this causes discomfort, gargle with warm salt water. The discomfort should disappear within 24 hours.       

## 2020-12-02 ENCOUNTER — Encounter (HOSPITAL_BASED_OUTPATIENT_CLINIC_OR_DEPARTMENT_OTHER): Payer: Self-pay | Admitting: Urology

## 2023-06-11 IMAGING — DX DG ABDOMEN 1V
2 series · 2 of 2 positions shown · non-contrast
Comparison: CT 11/07/2020.  Radiograph 11/19/2020

CLINICAL DATA: Pre lithotripsy for right-sided stone.

EXAM:
ABDOMEN - 1 VIEW

[abdomen kub (1 of 2)]
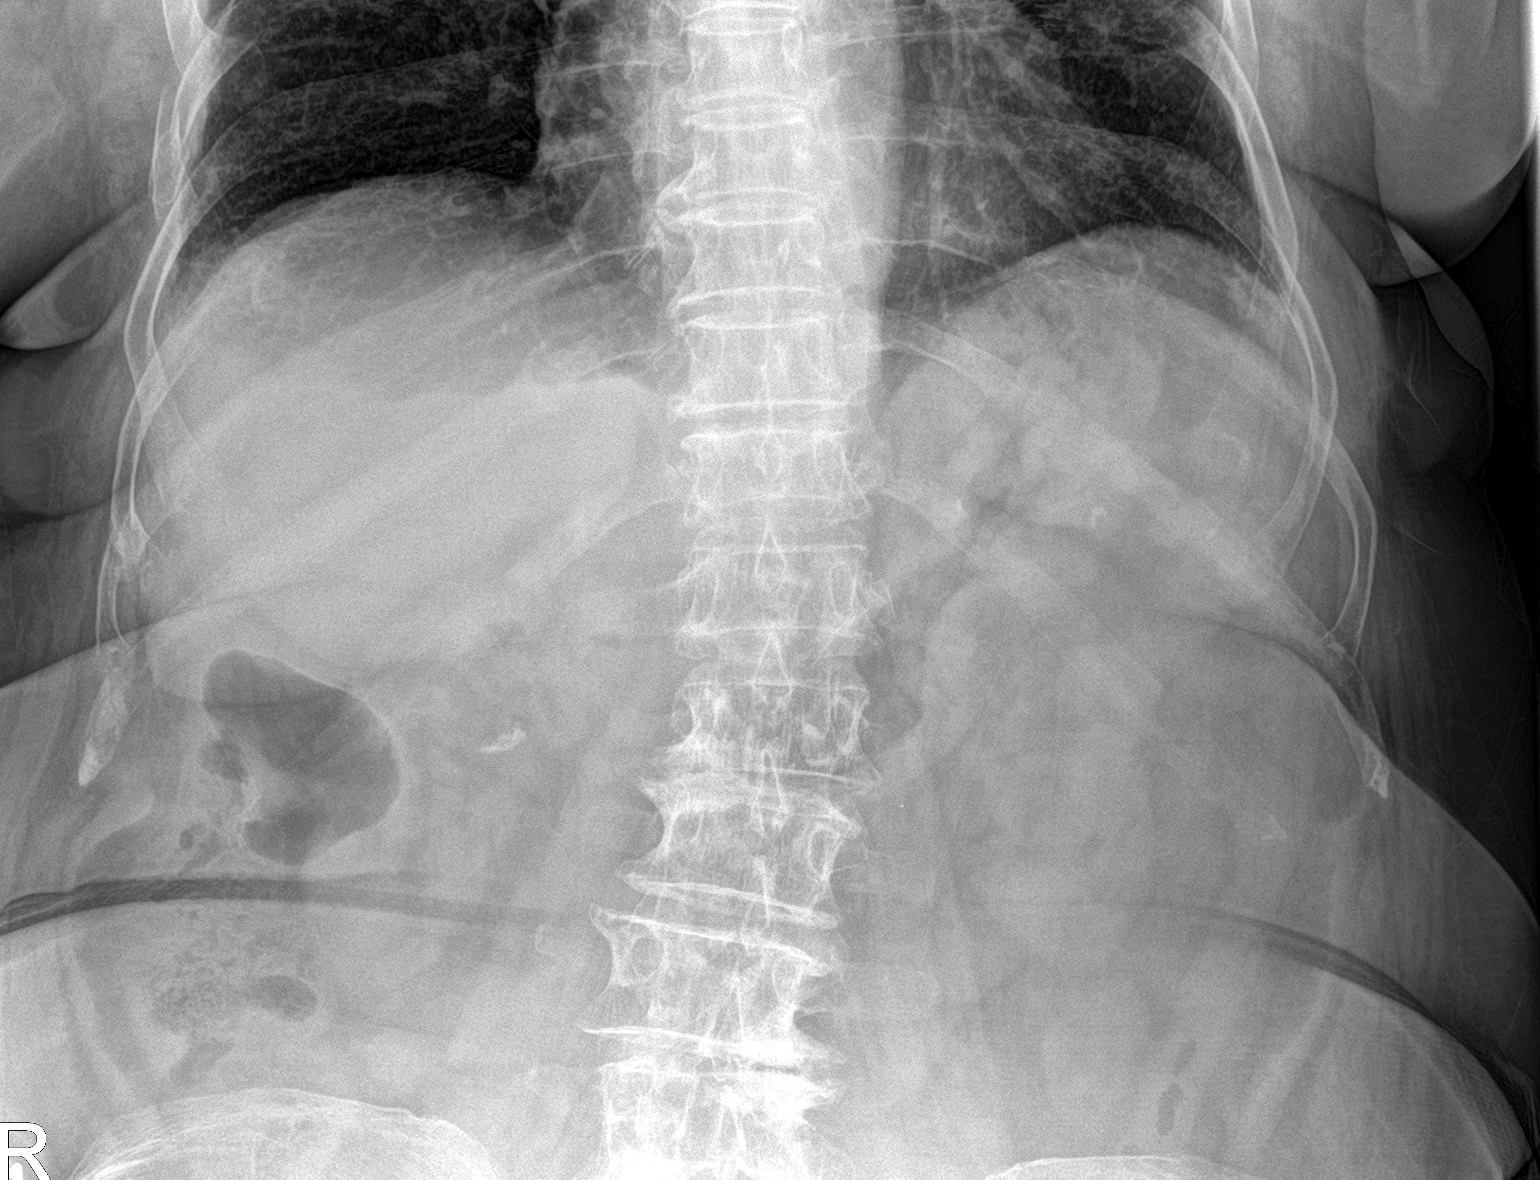

[abdomen kub (2 of 2)]
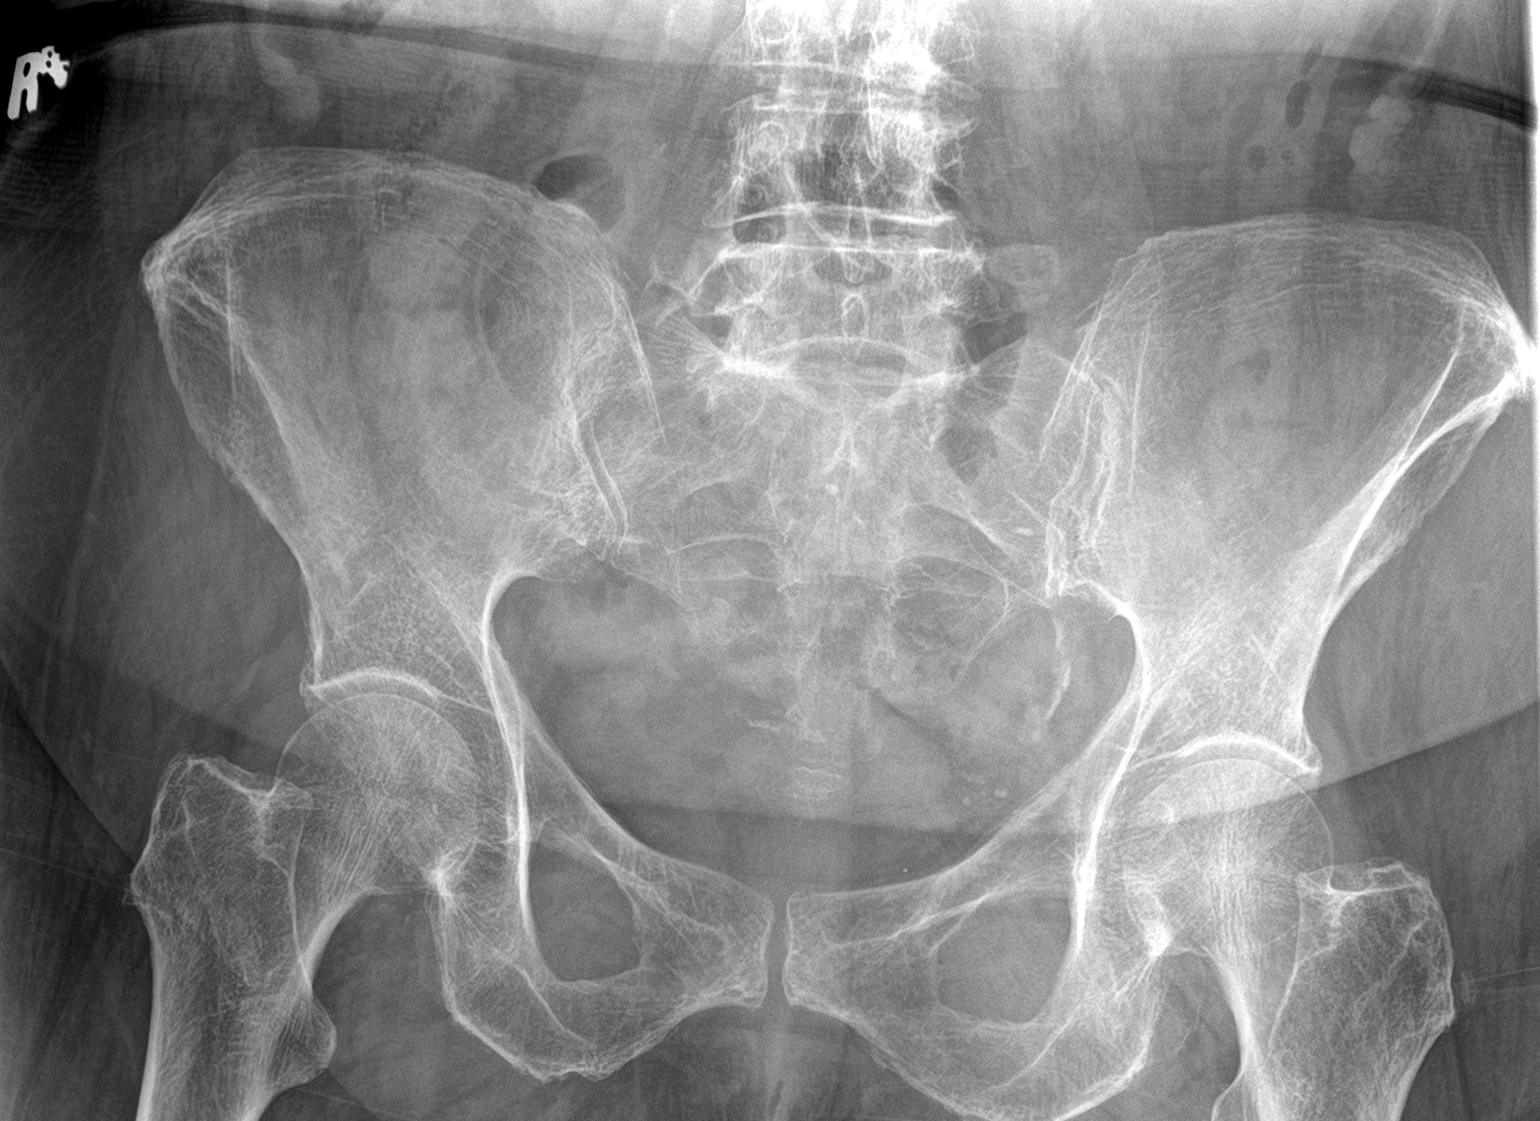

[2 of 2 positions shown; findings below may reference images not displayed]

FINDINGS: Elongated 10 mm calcification projects over the central right renal
shadow corresponding to stone on prior CT. No evidence of additional
renal calculi. Calcifications in the left pelvis are vascular and
phleboliths. Normal bowel gas pattern with small volume of colonic
stool. Included lung bases are clear. Scoliotic curvature of the
spine. No acute osseous abnormalities are seen.
IMPRESSION: A 10 mm calcification projects over the central right renal shadow
corresponding to stone on prior CT.

## 2023-11-20 DEATH — deceased
